# Patient Record
Sex: Male | Born: 1980 | Race: White | Hispanic: No | Marital: Single | Smoking: Current every day smoker
Health system: Southern US, Community
[De-identification: ages and names within clinical notes are randomized; demographics above are authoritative.]

## PROBLEM LIST (undated history)

## (undated) DIAGNOSIS — B1920 Unspecified viral hepatitis C without hepatic coma: Secondary | ICD-10-CM

---
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
## 2015-12-15 DIAGNOSIS — B1920 Unspecified viral hepatitis C without hepatic coma: Secondary | ICD-10-CM

## 2015-12-15 HISTORY — DX: Unspecified viral hepatitis C without hepatic coma: B19.20

## 2017-12-14 ENCOUNTER — Encounter (HOSPITAL_COMMUNITY): Payer: Self-pay | Admitting: Emergency Medicine

## 2017-12-14 ENCOUNTER — Emergency Department (HOSPITAL_COMMUNITY): Payer: Self-pay

## 2017-12-14 ENCOUNTER — Other Ambulatory Visit: Payer: Self-pay

## 2017-12-14 ENCOUNTER — Emergency Department (HOSPITAL_COMMUNITY)
Admission: EM | Admit: 2017-12-14 | Discharge: 2017-12-14 | Disposition: A | Discharge status: Home / Self Care | Payer: Self-pay | Attending: Emergency Medicine | Admitting: Emergency Medicine

## 2017-12-14 DIAGNOSIS — R51 Headache: Secondary | ICD-10-CM | POA: Insufficient documentation

## 2017-12-14 DIAGNOSIS — Y929 Unspecified place or not applicable: Secondary | ICD-10-CM | POA: Insufficient documentation

## 2017-12-14 DIAGNOSIS — W19XXXA Unspecified fall, initial encounter: Secondary | ICD-10-CM

## 2017-12-14 DIAGNOSIS — Y9389 Activity, other specified: Secondary | ICD-10-CM | POA: Insufficient documentation

## 2017-12-14 DIAGNOSIS — W010XXA Fall on same level from slipping, tripping and stumbling without subsequent striking against object, initial encounter: Secondary | ICD-10-CM | POA: Insufficient documentation

## 2017-12-14 DIAGNOSIS — F17210 Nicotine dependence, cigarettes, uncomplicated: Secondary | ICD-10-CM | POA: Insufficient documentation

## 2017-12-14 DIAGNOSIS — Y998 Other external cause status: Secondary | ICD-10-CM | POA: Insufficient documentation

## 2017-12-14 DIAGNOSIS — S161XXA Strain of muscle, fascia and tendon at neck level, initial encounter: Secondary | ICD-10-CM | POA: Insufficient documentation

## 2017-12-14 DIAGNOSIS — M79604 Pain in right leg: Secondary | ICD-10-CM | POA: Insufficient documentation

## 2017-12-14 HISTORY — DX: Unspecified viral hepatitis C without hepatic coma: B19.20

## 2017-12-14 MED ORDER — METHOCARBAMOL 500 MG PO TABS: 1000.0000 mg | ORAL_TABLET | Freq: Three times a day (TID) | ORAL | 0 refills | Status: AC | PRN

## 2017-12-14 MED ORDER — METHOCARBAMOL 500 MG PO TABS
1000.0000 mg | ORAL_TABLET | Freq: Once | ORAL | Status: AC
  Administered 2017-12-14: 1000 mg via ORAL
  Filled 2017-12-14: qty 2

## 2017-12-14 MED ORDER — KETOROLAC TROMETHAMINE 60 MG/2ML IM SOLN
60.0000 mg | Freq: Once | INTRAMUSCULAR | Status: AC
  Administered 2017-12-14: 60 mg via INTRAMUSCULAR
  Filled 2017-12-14: qty 2

## 2017-12-14 NOTE — ED Notes (Addendum)
Pt was able to get himself out of the car and stand and get himself into the wheelchair, while family states "he cannot move, he cannot walk." pt endorses a fall "maybee a week ago" with limited details. Endorses "head pressure and talking crazy, and neck pain"

## 2017-12-14 NOTE — ED Provider Notes (Signed)
[facility] EMERGENCY DEPARTMENT Provider Note   CSN: [id number] Arrival date & time: 12/14/17  0734     History   Chief Complaint Chief Complaint  Patient presents with  . Fall    HPI Kenneth Stephenson is a 37 y.o. male.  HPI Patient is a poor historian.  Thinks he fell from unknown height roughly a week ago.  He presents today because he "thinks he is going to die".  Complaining of headache, neck pain and pain to his right lower leg.  Admits to staff of IV drug use. Past Medical History:  Diagnosis Date  . Hepatitis C 12/15/2015    There are no active problems to display for this patient.       Home Medications    Prior to Admission medications   Medication Sig Start Date End Date Taking? Authorizing Provider  methocarbamol (ROBAXIN) 500 MG tablet Take 2 tablets (1,000 mg total) by mouth every 8 (eight) hours as needed for muscle spasms. 12/14/17   Loren Racer, MD    Family History History reviewed. No pertinent family history.  Social History Social History   Tobacco Use  . Smoking status: Current Every Day Smoker    Packs/day: 1.00    Years: 5.00    Pack years: 5.00    Types: Cigarettes  Substance Use Topics  . Alcohol use: Yes  . Drug use: Yes    Types: IV, Methamphetamines     Allergies   Patient has no allergy information on record.   Review of Systems Review of Systems  Constitutional: Negative for chills and fever.  Eyes: Negative for visual disturbance.  Respiratory: Negative for shortness of breath.   Cardiovascular: Negative for chest pain.  Gastrointestinal: Negative for abdominal pain, nausea and vomiting.  Musculoskeletal: Positive for myalgias and neck pain. Negative for back pain.  Skin: Negative for rash and wound.  Neurological: Positive for headaches. Negative for dizziness, weakness, light-headedness and numbness.  All other systems reviewed and are negative.    Physical Exam Updated Vital Signs BP  104/70 (BP Location: Right Arm)   Pulse (!) 108   Temp (!) 97.5 F (36.4 C) (Oral)   Resp 16   Ht 5\' 11"  (1.803 m)   Wt 72.6 kg   SpO2 98%   BMI 22.32 kg/m   Physical Exam  Constitutional: He is oriented to person, place, and time. He appears well-developed and well-nourished. No distress.  HENT:  Head: Normocephalic and atraumatic.  Mouth/Throat: Oropharynx is clear and moist.  No obvious head trauma.  Midface is stable.  No intraoral trauma.  Eyes:  Pinpoint pupils bilaterally  Neck:  Diffuse posterior cervical tenderness to palpation.  Patient actively resisting exam.  Cardiovascular: Normal rate and regular rhythm. Exam reveals no gallop and no friction rub.  No murmur heard. Pulmonary/Chest: Effort normal and breath sounds normal.  Abdominal: Soft. Bowel sounds are normal. There is no tenderness. There is no rebound and no guarding.  Musculoskeletal: Normal range of motion. He exhibits no edema or tenderness.  Chronic appearing soft tissue swelling of the right pretibial lower extremity.  No warmth.  Full range of motion of the right ankle and right knee.  Neurological: He is alert and oriented to person, place, and time.  5/5 motor in all extremities.  Witnessed transferring to wheelchair.  Skin: Skin is warm and dry. No rash noted. He is not diaphoretic. No erythema.  Psychiatric: He has a normal mood and affect. His behavior is  normal.  Nursing note and vitals reviewed.    ED Treatments / Results  Labs (all labs ordered are listed, but only abnormal results are displayed) Labs Reviewed - No data to display  EKG None  Radiology Dg Tibia/fibula Right  Result Date: 12/14/2017 CLINICAL DATA:  Neck and right leg pain after falling from a tree 1 week ago. EXAM: RIGHT TIBIA AND FIBULA - 2 VIEW COMPARISON:  Right femur radiographs 07/09/2013. CT of the right lower leg 03/04/2007. FINDINGS: There is a tibial intramedullary nail secured by 1 proximal and 2 distal  interlocking screws. There are additional screws within the medial tibial plateau. The hardware is intact without evidence of loosening. The previously demonstrated fractures of the mid tibia and fibula have healed with some posttraumatic deformity. No evidence of acute fracture or dislocation. There is possible soft tissue thickening anteriorly in the mid lower leg. IMPRESSION: No evidence of acute fracture or dislocation. Posttraumatic deformities of the mid tibia and fibula related to previous fracture and tibial ORIF. Possible pretibial soft tissue swelling. Electronically Signed   By: Carey BullocksWilliam  Veazey M.D.   On: 12/14/2017 08:44   Ct Head Wo Contrast  Result Date: 12/14/2017 CLINICAL DATA:  Fall from a tree 3 days ago. Loss of consciousness with decreased memory, headache and slurred speech. Neck pain. EXAM: CT HEAD WITHOUT CONTRAST CT CERVICAL SPINE WITHOUT CONTRAST TECHNIQUE: Multidetector CT imaging of the head and cervical spine was performed following the standard protocol without intravenous contrast. Multiplanar CT image reconstructions of the cervical spine were also generated. COMPARISON:  CT head 12/13/2016. Cervical spine radiographs 12/13/2016 and cervical spine CT 07/09/2013. FINDINGS: CT HEAD FINDINGS Brain: There is no evidence of acute intracranial hemorrhage, mass lesion, brain edema or extra-axial fluid collection. The ventricles and subarachnoid spaces are appropriately sized for age. There is no CT evidence of acute cortical infarction. Vascular:  No hyperdense vessel identified. Skull: Negative for fracture or focal lesion. Sinuses/Orbits: Small mucous retention cyst in the right division of the sphenoid sinus. The visualized paranasal sinuses and mastoid air cells are otherwise clear. No orbital abnormalities are seen. Other: Stable mild prominence of the nasopharyngeal soft tissues. CT CERVICAL SPINE FINDINGS Alignment: Normal. Skull base and vertebrae: No evidence of acute fracture  or traumatic subluxation. 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 There are progressive endplate degenerative changes at C6-7. Soft tissues and spinal canal: No prevertebral fluid or swelling. No visible canal hematoma. Disc levels: Spondylosis at C6-7 contributes to mild foraminal narrowing bilaterally. No large disc herniation identified. Upper chest: Unremarkable. Other: Small cervical lymph nodes are similar to the previous study. Soft tissue evaluation limited by the lack of contrast and limited fat. IMPRESSION: 1. Stable head CT without acute intracranial findings. 2. No evidence of acute cervical spine fracture, traumatic subluxation or static signs of instability. 3. Mildly progressive spondylosis at C6-7 with resulting mild osseous foraminal narrowing bilaterally. Electronically Signed   By: Carey BullocksWilliam  Veazey M.D.   On: 12/14/2017 08:32   Ct Cervical Spine Wo Contrast  Result Date: 12/14/2017 CLINICAL DATA:  Fall from a tree 3 days ago. Loss of consciousness with decreased memory, headache and slurred speech. Neck pain. EXAM: CT HEAD WITHOUT CONTRAST CT CERVICAL SPINE WITHOUT CONTRAST TECHNIQUE: Multidetector CT imaging of the head and cervical spine was performed following the standard protocol without intravenous contrast. Multiplanar CT image reconstructions of the cervical spine were also generated. COMPARISON:  CT head 12/13/2016. Cervical spine radiographs 12/13/2016 and cervical spine CT 07/09/2013.  FINDINGS: CT HEAD FINDINGS Brain: There is no evidence of acute intracranial hemorrhage, mass lesion, brain edema or extra-axial fluid collection. The ventricles and subarachnoid spaces are appropriately sized for age. There is no CT evidence of acute cortical infarction. Vascular:  No hyperdense vessel identified. Skull: Negative for fracture or focal lesion. Sinuses/Orbits: Small mucous retention cyst in the right division of the sphenoid sinus. The visualized paranasal sinuses and  mastoid air cells are otherwise clear. No orbital abnormalities are seen. Other: Stable mild prominence of the nasopharyngeal soft tissues. CT CERVICAL SPINE FINDINGS Alignment: Normal. Skull base and vertebrae: No evidence of acute fracture or traumatic subluxation. 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 There are progressive endplate degenerative changes at C6-7. Soft tissues and spinal canal: No prevertebral fluid or swelling. No visible canal hematoma. Disc levels: Spondylosis at C6-7 contributes to mild foraminal narrowing bilaterally. No large disc herniation identified. Upper chest: Unremarkable. Other: Small cervical lymph nodes are similar to the previous study. Soft tissue evaluation limited by the lack of contrast and limited fat. IMPRESSION: 1. Stable head CT without acute intracranial findings. 2. No evidence of acute cervical spine fracture, traumatic subluxation or static signs of instability. 3. Mildly progressive spondylosis at C6-7 with resulting mild osseous foraminal narrowing bilaterally. Electronically Signed   By: Carey Bullocks M.D.   On: 12/14/2017 08:32    Procedures Procedures (including critical care time)  Medications Ordered in ED Medications  ketorolac (TORADOL) injection 60 mg (60 mg Intramuscular Given 12/14/17 0846)  methocarbamol (ROBAXIN) tablet 1,000 mg (1,000 mg Oral Given 12/14/17 0846)     Initial Impression / Assessment and Plan / ED Course  I have reviewed the triage vital signs and the nursing notes.  Pertinent labs & imaging results that were available during my care of the patient were reviewed by me and considered in my medical decision making (see chart for details).     No acute findings on CT head or CT cervical spine.  Patient with soft tissue swelling over the right lower extremity from previous fracture and fixation.  No obvious signs of infectious process.  Will treat symptomatically.  Return precautions given.  Final  Clinical Impressions(s) / ED Diagnoses   Final diagnoses:  Fall, initial encounter  Acute strain of neck muscle, initial encounter  Right leg pain    ED Discharge Orders         Ordered    methocarbamol (ROBAXIN) 500 MG tablet  Every 8 hours PRN     12/14/17 0940           Loren Racer, MD 12/15/17 [phone]

## 2017-12-14 NOTE — ED Triage Notes (Signed)
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  Pt states he does not know exactly how far he fell but thinks it was around 1320ft.  He complains of neck pain and right leg pain.  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  AOx4

## 2017-12-14 NOTE — ED Notes (Signed)
Pt states he will call his mother to pick him up as she brought him to ED. Pt given rx x 1 given w/discharge instructions. Pt noted to be irritable.

## 2019-08-12 IMAGING — CT CT CERVICAL SPINE W/O CM
3 of 4 series · 13 of 33 positions shown, 16 images · non-contrast
Comparison: CT head 12/13/2016. Cervical spine radiographs
12/13/2016 and cervical spine CT 07/09/2013.

CLINICAL DATA: Fall from a tree 3 days ago. Loss of consciousness
with decreased memory, headache and slurred speech. Neck pain.

EXAM:
CT HEAD WITHOUT CONTRAST
CT CERVICAL SPINE WITHOUT CONTRAST
TECHNIQUE: Multidetector CT imaging of the head and cervical spine was
performed following the standard protocol without intravenous
contrast. Multiplanar CT image reconstructions of the cervical spine
were also generated.

[Series 4: c_spine 2.0 st · axial · 0.30mm/px · z∈[-341,-215]mm · 5 of 95 slices shown, 7 images]
[im 16/95  soft-tissue]
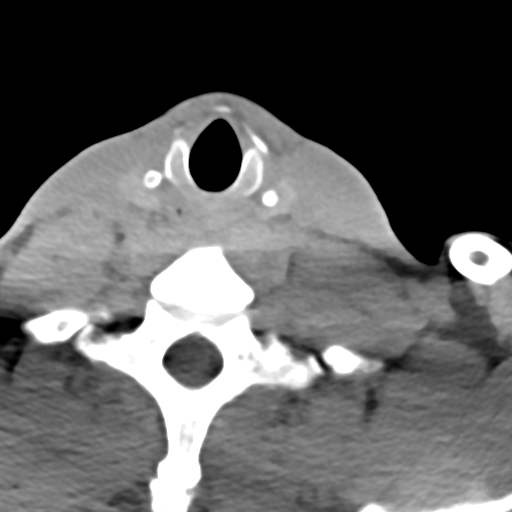
[im 16/95  bone]
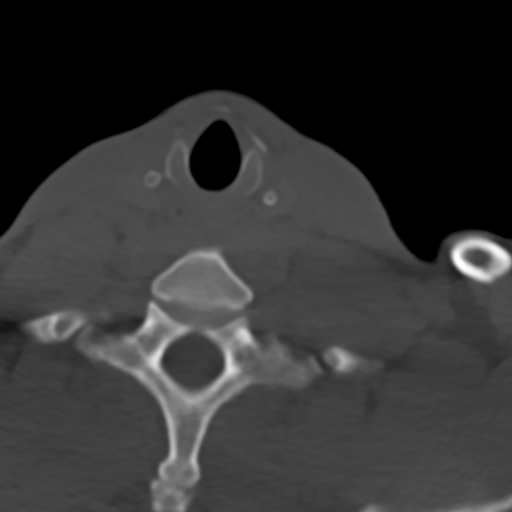
[im 32/95  bone]
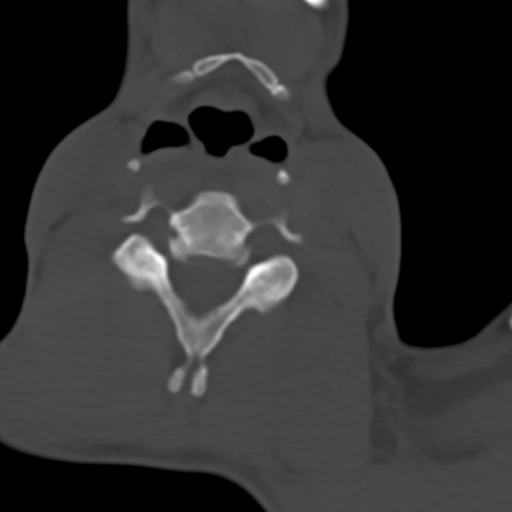
[im 48/95  bone]
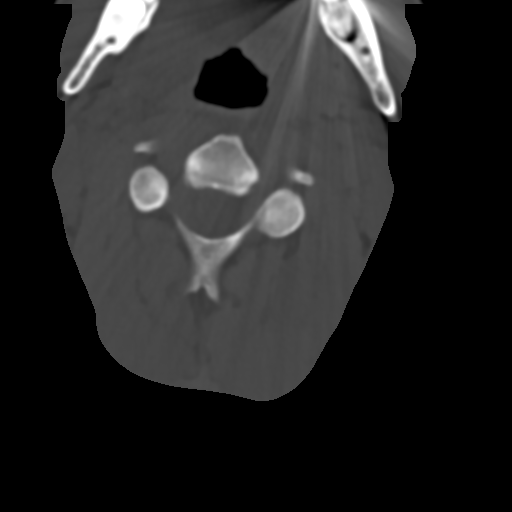
[im 63/95  bone]
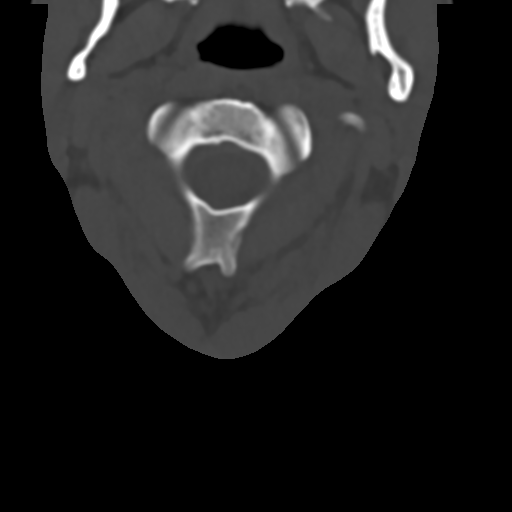
[im 79/95  soft-tissue]
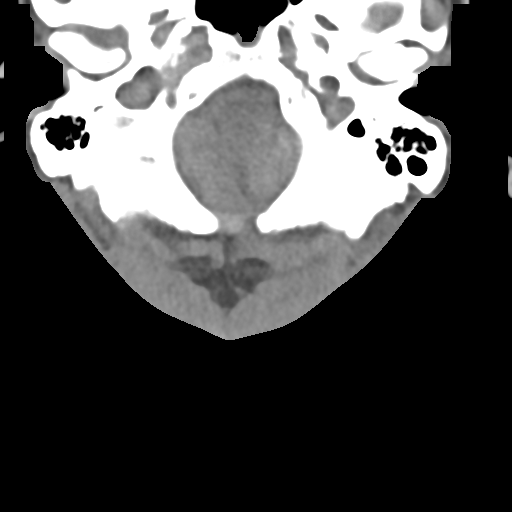
[im 79/95  bone]
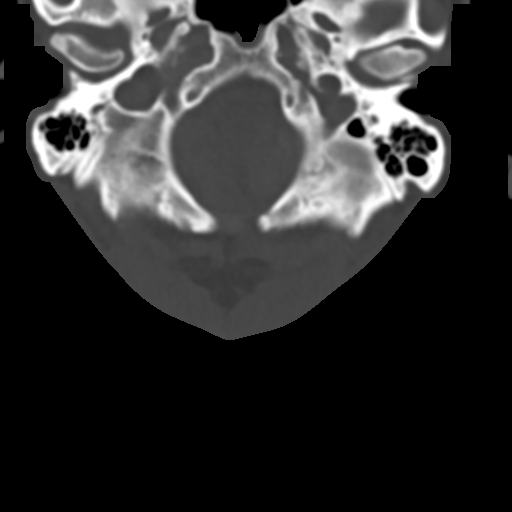

[Series 6: c_spine 2.0 sag bone · sagittal · 0.32mm/px · 5 of 61 slices shown, 6 images]
[im 21/61  bone]
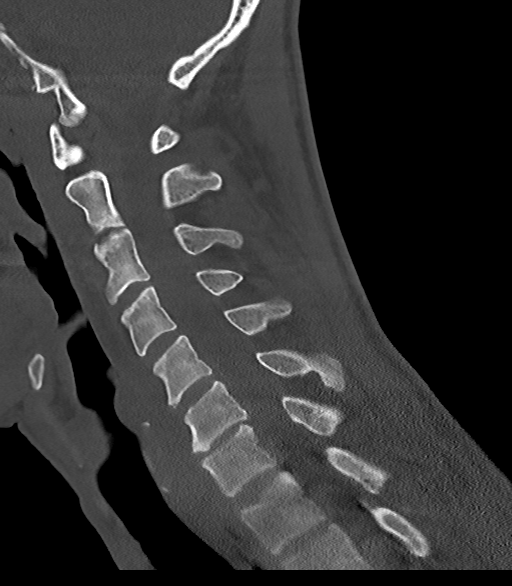
[im 26/61  bone]
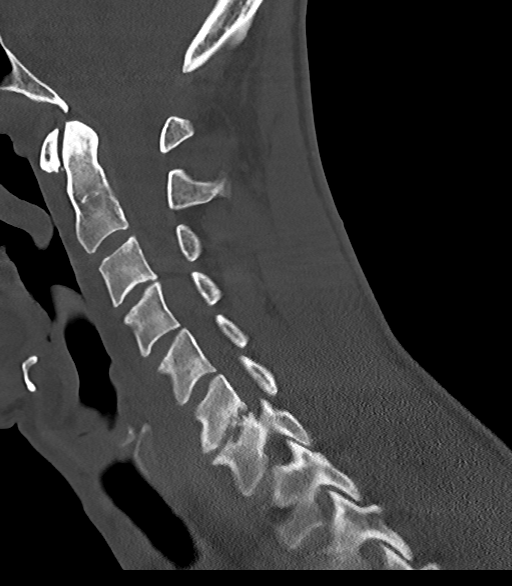
[im 31/61  soft-tissue]
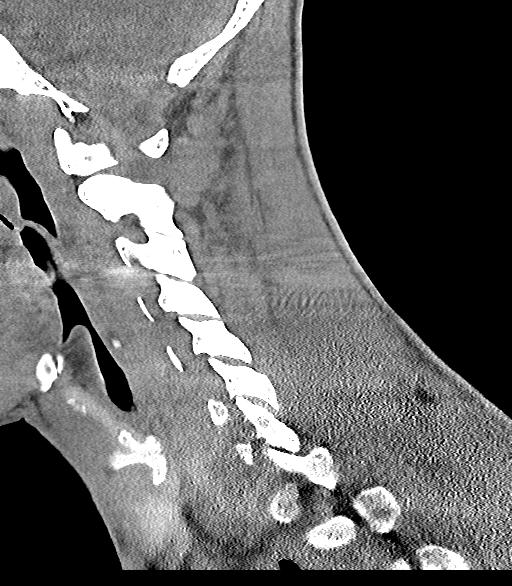
[im 31/61  bone]
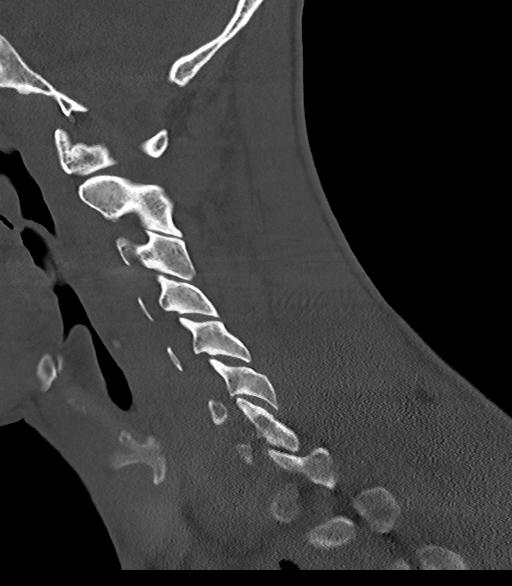
[im 36/61  bone]
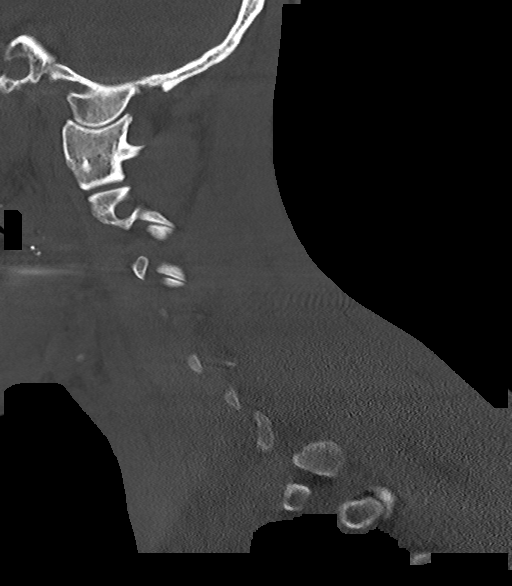
[im 41/61  bone]
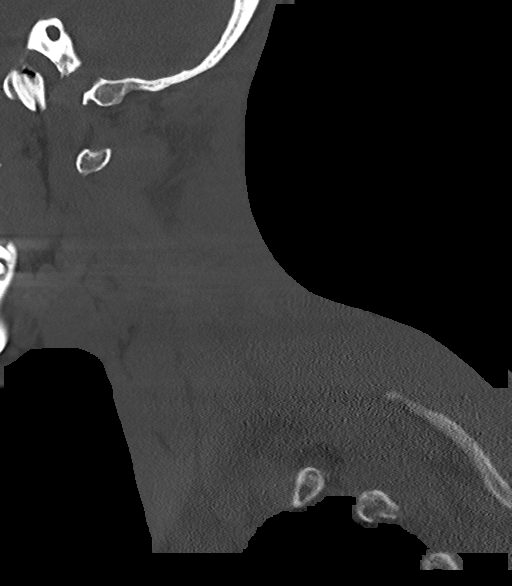

[Series 7: c_spine 2.0 cor bone · coronal · 0.28mm/px · 3 of 65 slices shown]
[im 18/65  bone]
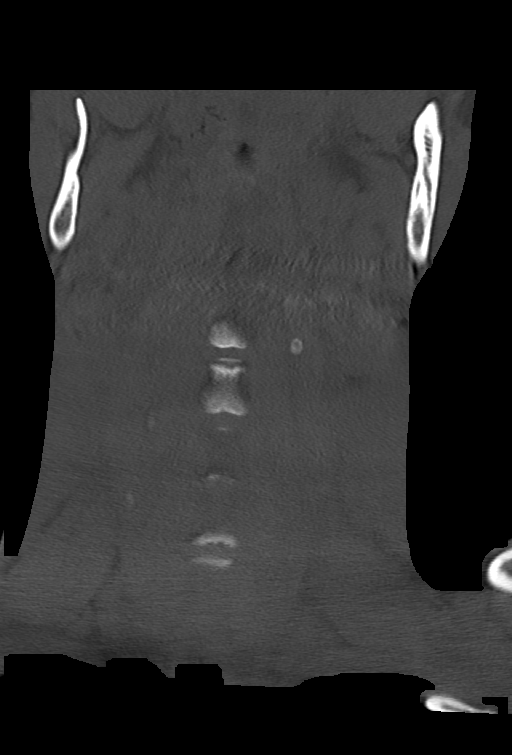
[im 28/65  bone]
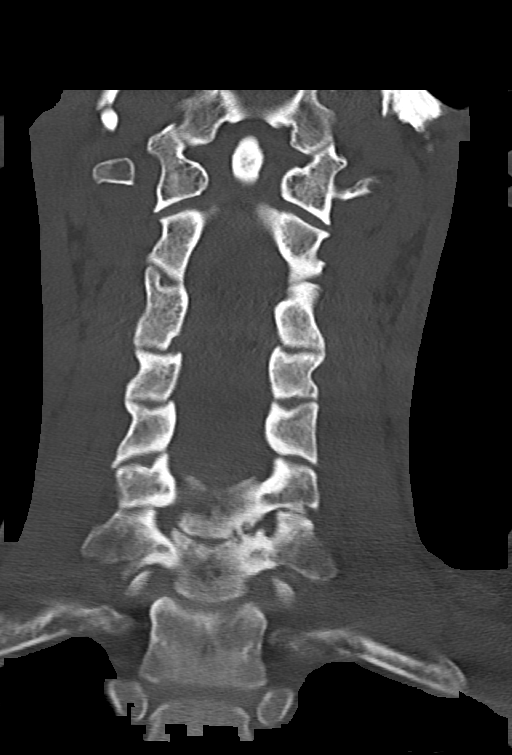
[im 38/65  bone]
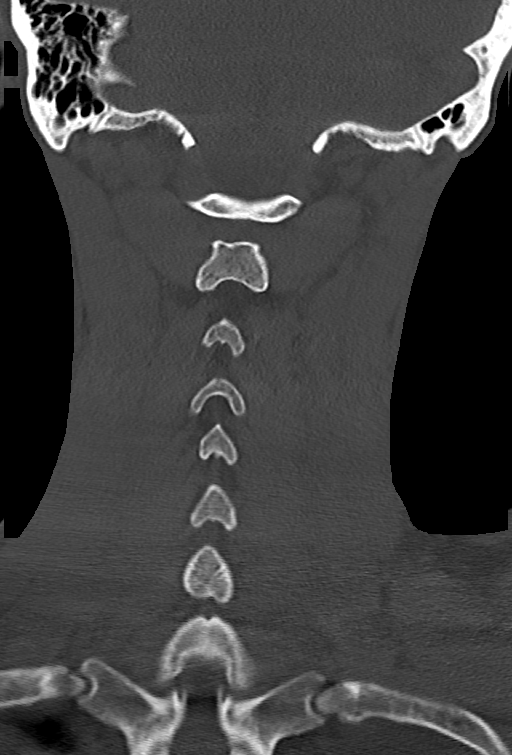

[13 of 33 positions shown; findings below may reference images not displayed]

FINDINGS: CT HEAD FINDINGS

Brain: There is no evidence of acute intracranial hemorrhage, mass
lesion, brain edema or extra-axial fluid collection. The ventricles
and subarachnoid spaces are appropriately sized for age. There is no
CT evidence of acute cortical infarction.

Vascular:  No hyperdense vessel identified.

Skull: Negative for fracture or focal lesion.

Sinuses/Orbits: Small mucous retention cyst in the right division of
the sphenoid sinus. The visualized paranasal sinuses and mastoid air
cells are otherwise clear. No orbital abnormalities are seen.

Other: Stable mild prominence of the nasopharyngeal soft tissues.

CT CERVICAL SPINE FINDINGS

Alignment: Normal.

Skull base and vertebrae: No evidence of acute fracture or traumatic
subluxation. Stable chronic segmentation of the T1 spinous process.
There are progressive endplate degenerative changes at C6-7.

Soft tissues and spinal canal: No prevertebral fluid or swelling. No
visible canal hematoma.

Disc levels: Spondylosis at C6-7 contributes to mild foraminal
narrowing bilaterally. No large disc herniation identified.

Upper chest: Unremarkable.

Other: Small cervical lymph nodes are similar to the previous study.
Soft tissue evaluation limited by the lack of contrast and limited
fat.
IMPRESSION: 1. Stable head CT without acute intracranial findings.
2. No evidence of acute cervical spine fracture, traumatic
subluxation or static signs of instability.
3. Mildly progressive spondylosis at C6-7 with resulting mild
osseous foraminal narrowing bilaterally.
